# Patient Record
Sex: Male | Born: 1990 | Race: White | Hispanic: No | Marital: Single | Smoking: Current every day smoker
Health system: Southern US, Community
[De-identification: ages and names within clinical notes are randomized; demographics above are authoritative.]

## PROBLEM LIST (undated history)

## (undated) DIAGNOSIS — I10 Essential (primary) hypertension: Secondary | ICD-10-CM

## (undated) DIAGNOSIS — F419 Anxiety disorder, unspecified: Secondary | ICD-10-CM

---

## 2011-07-31 ENCOUNTER — Encounter (HOSPITAL_COMMUNITY): Payer: Self-pay

## 2011-07-31 ENCOUNTER — Emergency Department (HOSPITAL_COMMUNITY)
Admission: EM | Admit: 2011-07-31 | Discharge: 2011-07-31 | Disposition: A | Discharge status: Home / Self Care | Payer: Self-pay | Attending: Emergency Medicine | Admitting: Emergency Medicine

## 2011-07-31 ENCOUNTER — Emergency Department (HOSPITAL_COMMUNITY): Payer: Self-pay

## 2011-07-31 DIAGNOSIS — I10 Essential (primary) hypertension: Secondary | ICD-10-CM | POA: Insufficient documentation

## 2011-07-31 DIAGNOSIS — F411 Generalized anxiety disorder: Secondary | ICD-10-CM | POA: Insufficient documentation

## 2011-07-31 DIAGNOSIS — F17200 Nicotine dependence, unspecified, uncomplicated: Secondary | ICD-10-CM | POA: Insufficient documentation

## 2011-07-31 DIAGNOSIS — S63509A Unspecified sprain of unspecified wrist, initial encounter: Secondary | ICD-10-CM | POA: Insufficient documentation

## 2011-07-31 DIAGNOSIS — X500XXA Overexertion from strenuous movement or load, initial encounter: Secondary | ICD-10-CM | POA: Insufficient documentation

## 2011-07-31 DIAGNOSIS — S63501A Unspecified sprain of right wrist, initial encounter: Secondary | ICD-10-CM

## 2011-07-31 HISTORY — DX: Essential (primary) hypertension: I10

## 2011-07-31 HISTORY — DX: Anxiety disorder, unspecified: F41.9

## 2011-07-31 MED ORDER — HYDROCODONE-ACETAMINOPHEN 5-500 MG PO TABS: 1.0000 | ORAL_TABLET | Freq: Four times a day (QID) | ORAL | Status: AC | PRN

## 2011-07-31 MED ORDER — NAPROXEN 500 MG PO TABS: 500.0000 mg | ORAL_TABLET | Freq: Two times a day (BID) | ORAL | Status: AC

## 2011-07-31 NOTE — ED Notes (Signed)
Patient remains alertx4, NAD. Capillary refill <3 in right wrist.
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
## 2011-07-31 NOTE — Discharge Instructions (Signed)
Take the medication as prescribed, use the splint for the next week, followup with your Dr. if still having pain in 7 days. You may need to have a repeat x-ray at that time if still having significant discomfort.
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
## 2013-11-06 ENCOUNTER — Encounter (HOSPITAL_COMMUNITY): Payer: Self-pay | Admitting: Emergency Medicine

## 2013-11-06 ENCOUNTER — Emergency Department (HOSPITAL_COMMUNITY)
Admission: EM | Admit: 2013-11-06 | Discharge: 2013-11-06 | Disposition: A | Discharge status: Home / Self Care | Payer: Self-pay | Attending: Emergency Medicine | Admitting: Emergency Medicine

## 2013-11-06 DIAGNOSIS — R112 Nausea with vomiting, unspecified: Secondary | ICD-10-CM | POA: Insufficient documentation

## 2013-11-06 DIAGNOSIS — F17200 Nicotine dependence, unspecified, uncomplicated: Secondary | ICD-10-CM | POA: Insufficient documentation

## 2013-11-06 DIAGNOSIS — R1011 Right upper quadrant pain: Secondary | ICD-10-CM | POA: Insufficient documentation

## 2013-11-06 DIAGNOSIS — I10 Essential (primary) hypertension: Secondary | ICD-10-CM | POA: Insufficient documentation

## 2013-11-06 DIAGNOSIS — Z8659 Personal history of other mental and behavioral disorders: Secondary | ICD-10-CM | POA: Insufficient documentation

## 2013-11-06 LAB — COMPREHENSIVE METABOLIC PANEL
ALBUMIN: 5 g/dL (ref 3.5–5.2)
ALT: 13 U/L (ref 0–53)
AST: 17 U/L (ref 0–37)
Alkaline Phosphatase: 87 U/L (ref 39–117)
Anion gap: 16 — ABNORMAL HIGH (ref 5–15)
BUN: 11 mg/dL (ref 6–23)
CALCIUM: 10.1 mg/dL (ref 8.4–10.5)
CHLORIDE: 96 meq/L (ref 96–112)
CO2: 26 mEq/L (ref 19–32)
CREATININE: 0.85 mg/dL (ref 0.50–1.35)
GFR calc Af Amer: >90 mL/min (ref >90)
GFR calc non Af Amer: >90 mL/min (ref >90)
Glucose, Bld: 97 mg/dL (ref 70–99)
Potassium: 3.7 mEq/L (ref 3.7–5.3)
SODIUM: 138 meq/L (ref 137–147)
Total Bilirubin: 0.8 mg/dL (ref 0.3–1.2)
Total Protein: 8 g/dL (ref 6.0–8.3)

## 2013-11-06 LAB — CBC WITH DIFFERENTIAL/PLATELET
BASOS ABS: 0 10*3/uL (ref 0.0–0.1)
BASOS PCT: 0 % (ref 0–1)
EOS PCT: 1 % (ref 0–5)
Eosinophils Absolute: 0.1 10*3/uL (ref 0.0–0.7)
HEMATOCRIT: 44.8 % (ref 39.0–52.0)
Hemoglobin: 16.6 g/dL (ref 13.0–17.0)
LYMPHS PCT: 18 % (ref 12–46)
Lymphs Abs: 1.6 10*3/uL (ref 0.7–4.0)
MCH: 31.4 pg (ref 26.0–34.0)
MCHC: 37.1 g/dL — AB (ref 30.0–36.0)
MCV: 84.7 fL (ref 78.0–100.0)
MONO ABS: 0.5 10*3/uL (ref 0.1–1.0)
Monocytes Relative: 5 % (ref 3–12)
NEUTROS ABS: 6.5 10*3/uL (ref 1.7–7.7)
Neutrophils Relative %: 76 % (ref 43–77)
PLATELETS: 212 10*3/uL (ref 150–400)
RBC: 5.29 MIL/uL (ref 4.22–5.81)
RDW: 11.5 % (ref 11.5–15.5)
WBC: 8.6 10*3/uL (ref 4.0–10.5)

## 2013-11-06 LAB — URINALYSIS, ROUTINE W REFLEX MICROSCOPIC
Bilirubin Urine: NEGATIVE
GLUCOSE, UA: NEGATIVE mg/dL
Hgb urine dipstick: NEGATIVE
Ketones, ur: 40 mg/dL — AB
LEUKOCYTES UA: NEGATIVE
NITRITE: NEGATIVE
PH: 6 (ref 5.0–8.0)
Protein, ur: NEGATIVE mg/dL
Urobilinogen, UA: 0.2 mg/dL (ref 0.0–1.0)

## 2013-11-06 LAB — LIPASE, BLOOD: LIPASE: 29 U/L (ref 11–59)

## 2013-11-06 MED ORDER — DICYCLOMINE HCL 20 MG PO TABS: 20.0000 mg | ORAL_TABLET | Freq: Two times a day (BID) | ORAL | Status: AC

## 2013-11-06 MED ORDER — KETOROLAC TROMETHAMINE 30 MG/ML IJ SOLN
30.0000 mg | Freq: Once | INTRAMUSCULAR | Status: AC
  Administered 2013-11-06: 30 mg via INTRAVENOUS
  Filled 2013-11-06: qty 1

## 2013-11-06 MED ORDER — DICYCLOMINE HCL 10 MG/ML IM SOLN
20.0000 mg | Freq: Once | INTRAMUSCULAR | Status: AC
  Administered 2013-11-06: 20 mg via INTRAMUSCULAR
  Filled 2013-11-06: qty 2

## 2013-11-06 MED ORDER — MORPHINE SULFATE 4 MG/ML IJ SOLN
4.0000 mg | Freq: Once | INTRAMUSCULAR | Status: AC
  Administered 2013-11-06: 4 mg via INTRAVENOUS
  Filled 2013-11-06: qty 1

## 2013-11-06 MED ORDER — SODIUM CHLORIDE 0.9 % IV BOLUS (SEPSIS)
1000.0000 mL | Freq: Once | INTRAVENOUS | Status: AC
  Administered 2013-11-06: 1000 mL via INTRAVENOUS

## 2013-11-06 MED ORDER — ONDANSETRON HCL 4 MG/2ML IJ SOLN
4.0000 mg | Freq: Once | INTRAMUSCULAR | Status: AC
  Administered 2013-11-06: 4 mg via INTRAVENOUS
  Filled 2013-11-06: qty 2

## 2013-11-06 MED ORDER — PROMETHAZINE HCL 25 MG PO TABS
25.0000 mg | ORAL_TABLET | Freq: Four times a day (QID) | ORAL | Status: AC | PRN
Start: 2013-11-06 — End: ?

## 2013-11-06 NOTE — ED Notes (Signed)
Abdominal pain, started this morning at 0530. Real sharp, hurt to push on my abdomen per pt. My blood pressure is up and my chest has been hurting in the middle area.

## 2013-11-06 NOTE — Discharge Instructions (Signed)
You were seen in the emergency department for right upper abdominal pain and vomiting. This may be your gallbladder and we have recommended that if your pain continues, you return to the emergency department for ultrasound. Given you do not live in this area, I feel you're safe to go home and followup with the closest emergency department to you if your symptoms worsen or he develops fever, vomiting that will not stop, plantars are dark and tarry stools. It is also possible that this is caused by a viral illness or bad food exposure.   Abdominal Pain Many things can cause abdominal pain. Usually, abdominal pain is not caused by a disease and will improve without treatment. It can often be observed and treated at home. Your health care provider will do a physical exam and possibly order blood tests and X-rays to help determine the seriousness of your pain. However, in many cases, more time must pass before a clear cause of the pain can be found. Before that point, your health care provider may not know if you need more testing or further treatment. HOME CARE INSTRUCTIONS  Monitor your abdominal pain for any changes. The following actions may help to alleviate any discomfort you are experiencing:  Only take over-the-counter or prescription medicines as directed by your health care provider.  Do not take laxatives unless directed to do so by your health care provider.  Try a clear liquid diet (broth, tea, or water) as directed by your health care provider. Slowly move to a bland diet as tolerated. SEEK MEDICAL CARE IF:  You have unexplained abdominal pain.  You have abdominal pain associated with nausea or diarrhea.  You have pain when you urinate or have a bowel movement.  You experience abdominal pain that wakes you in the night.  You have abdominal pain that is worsened or improved by eating food.  You have abdominal pain that is worsened with eating fatty foods.  You have a fever. SEEK  IMMEDIATE MEDICAL CARE IF:   Your pain does not go away within 2 hours.  You keep throwing up (vomiting).  Your pain is felt only in portions of the abdomen, such as the right side or the left lower portion of the abdomen.  You pass bloody or black tarry stools. MAKE SURE YOU:  Understand these instructions.   Will watch your condition.   Will get help right away if you are not doing well or get worse.  Document Released: 02/02/2005 Document Revised: 04/30/2013 Document Reviewed: 01/02/2013 [facility] Patient Information 2015 [employer] [place]. This information is not intended to replace advice given to you by your health care provider. Make sure you discuss any questions you have with your health care provider.

## 2013-11-06 NOTE — ED Provider Notes (Signed)
This chart was scribed for Layla MawKristen N Gwynn Crossley, DO by Milly JakobJohn Lee Graves, ED Scribe. The patient was seen in room APA10/[facility]10. Patient's care was started at 9:27 PM.  CHIEF COMPLAINT: Abdominal Pain  HPI Comments: Matthew French is a 23 y.o. male with history of anxiety who presents to the Emergency Department complaining of constant, moderate abdominal pain in the right upper quadrant that started this morning. He reports associated nausea and vomiting. Denies any diarrhea, hematochezia or melena. No fevers or chills. No dysuria or hematuria. He states that his pain started after eating a sandwich and sweet potato fries. He states that he has had some similar pain in the past that starts after eating but never this severe. He states that he thinks he may have been eating bad fish last night. Denies any history of prior abdominal surgery.  ROS: See HPI Constitutional: no fever  Eyes: no drainage  ENT: no runny nose   Cardiovascular:  no chest pain  Resp: no SOB  GI: abdominal pain, nausea, vomiting GU: no dysuria, no hematuria, no hematochezia, no melena Integumentary: no rash  Allergy: no hives  Musculoskeletal: no leg swelling  Neurological: no slurred speech ROS otherwise negative  PAST MEDICAL HISTORY/PAST SURGICAL HISTORY:  Past Medical History  Diagnosis Date  . Anxiety   . Hypertension     MEDICATIONS:  Prior to Admission medications   Medication Sig Start Date End Date Taking? Authorizing Provider  Multiple Vitamins-Minerals (MULTIVITAMINS THER. W/MINERALS) TABS Take 1 tablet by mouth daily.   Yes Historical Provider, MD    ALLERGIES:  Allergies  Allergen Reactions  . Guaifenesin Er Hives  . Vicodin [Hydrocodone-Acetaminophen]     SOCIAL HISTORY:  History  Substance Use Topics  . Smoking status: Current Every Day Smoker    Types: Cigarettes  . Smokeless tobacco: Not on file  . Alcohol Use: Yes    FAMILY HISTORY: History reviewed. No pertinent family  history.  EXAM: BP 141/76  Pulse 94  Temp(Src) 97.6 F (36.4 C) (Oral)  Resp 24  Ht 5\' 6"  (1.676 m)  Wt 115 lb (52.164 kg)  BMI 18.57 kg/m2  SpO2 100% CONSTITUTIONAL: Alert and oriented and responds appropriately to questions. Well-appearing; well-nourished, nontoxic, in no distress HEAD: Normocephalic EYES: Conjunctivae clear, PERRL ENT: normal nose; no rhinorrhea; moist mucous membranes; pharynx without lesions noted NECK: Supple, no meningismus, no LAD  CARD: RRR; S1 and S2 appreciated; no murmurs, no clicks, no rubs, no gallops RESP: Normal chest excursion without splinting or tachypnea; breath sounds clear and equal bilaterally; no wheezes, no rhonchi, no rales,  ABD/GI: Normal bowel sounds; non-distended; soft, tender to palpation in the right upper corner without guarding or rebound, negative Murphy sign BACK:  The back appears normal and is non-tender to palpation, there is no CVA tenderness EXT: Normal ROM in all joints; non-tender to palpation; no edema; normal capillary refill; no cyanosis    SKIN: Normal color for age and race; warm NEURO: Moves all extremities equally PSYCH: The patient's mood and manner are appropriate. Grooming and personal hygiene are appropriate.  MEDICAL DECISION MAKING: Patient here with right upper quadrant abdominal pain. Differential diagnosis includes cholelithiasis, less likely cholecystitis, pancreatitis, colitis, gastritis, peptic ulcer. Will obtain abdominal labs, urine, give pain and nausea medicine and reassess.  ED PROGRESS: Patient reports some improvement after Toradol, Bentyl and Zofran. He has been able to tolerate by mouth. His labs are unremarkable. Have discussed with patient that we are unable to obtain an ultrasound tonight  and I do [facility] as I do not think that he has any life-threatening illness or surgical process currently. Given he has no leukocytosis, fever, elevation of his  LFTs and lipase, I feel he is safe to be discharged. Have offered him a ultrasound tomorrow morning but he states that he lives 2 hours away and cannot return to this emergency department. Have advised him that if his pain continues or worsens, he develops fever, bloody stools or melena, vomiting that will not stop, he should return to his closest emergency department. Patient verbalizes understanding is comfortable with this plan. We'll discharge him with prescription for Phenergan and Bentyl.    I personally performed the services described in this documentation, which was scribed in my presence. The recorded information has been reviewed and is accurate.     Layla MawKristen N Moana Munford, DO 11/06/13 2349
